# Patient Record
Sex: Female | Born: 1965
Health system: Southern US, Community
[De-identification: ages and names within clinical notes are randomized; demographics above are authoritative.]

## PROBLEM LIST (undated history)

## (undated) ENCOUNTER — Ambulatory Visit: Source: Home / Self Care

## (undated) DIAGNOSIS — E039 Hypothyroidism, unspecified: Secondary | ICD-10-CM

## (undated) DIAGNOSIS — I10 Essential (primary) hypertension: Secondary | ICD-10-CM

## (undated) DIAGNOSIS — H401131 Primary open-angle glaucoma, bilateral, mild stage: Secondary | ICD-10-CM

## (undated) DIAGNOSIS — R739 Hyperglycemia, unspecified: Secondary | ICD-10-CM

## (undated) DIAGNOSIS — J45909 Unspecified asthma, uncomplicated: Secondary | ICD-10-CM

## (undated) DIAGNOSIS — E7800 Pure hypercholesterolemia, unspecified: Secondary | ICD-10-CM

## (undated) DIAGNOSIS — E119 Type 2 diabetes mellitus without complications: Secondary | ICD-10-CM

## (undated) DIAGNOSIS — H409 Unspecified glaucoma: Secondary | ICD-10-CM

## (undated) DIAGNOSIS — R519 Headache, unspecified: Secondary | ICD-10-CM

## (undated) HISTORY — DX: Headache, unspecified: R51.9

## (undated) HISTORY — DX: Pure hypercholesterolemia, unspecified: E78.00

## (undated) HISTORY — DX: Hypothyroidism, unspecified: E03.9

## (undated) HISTORY — DX: Essential (primary) hypertension: I10

## (undated) HISTORY — DX: Type 2 diabetes mellitus without complications: E11.9

## (undated) HISTORY — DX: Hyperglycemia, unspecified: R73.9

## (undated) HISTORY — DX: Morbid (severe) obesity due to excess calories: E66.01

## (undated) HISTORY — DX: Unspecified glaucoma: H40.9

## (undated) HISTORY — DX: Primary open-angle glaucoma, bilateral, mild stage: H40.1131

## (undated) HISTORY — DX: Unspecified asthma, uncomplicated: J45.909

---

## 2001-05-13 ENCOUNTER — Other Ambulatory Visit: Admission: RE | Admit: 2001-05-13 | Discharge: 2001-05-13 | Payer: Self-pay | Admitting: Obstetrics and Gynecology

## 2001-09-23 ENCOUNTER — Other Ambulatory Visit: Admission: RE | Admit: 2001-09-23 | Discharge: 2001-09-23 | Payer: Self-pay | Admitting: Obstetrics and Gynecology

## 2002-05-12 ENCOUNTER — Other Ambulatory Visit: Admission: RE | Admit: 2002-05-12 | Discharge: 2002-05-12 | Payer: Self-pay | Admitting: Obstetrics and Gynecology

## 2002-12-28 ENCOUNTER — Other Ambulatory Visit: Admission: RE | Admit: 2002-12-28 | Discharge: 2002-12-28 | Payer: Self-pay | Admitting: Obstetrics and Gynecology

## 2004-06-16 ENCOUNTER — Encounter: Admission: RE | Admit: 2004-06-16 | Discharge: 2004-06-16 | Payer: Self-pay | Admitting: Obstetrics and Gynecology

## 2004-07-02 ENCOUNTER — Encounter: Admission: RE | Admit: 2004-07-02 | Discharge: 2004-07-02 | Payer: Self-pay | Admitting: Obstetrics and Gynecology

## 2004-11-27 ENCOUNTER — Other Ambulatory Visit: Admission: RE | Admit: 2004-11-27 | Discharge: 2004-11-27 | Payer: Self-pay | Admitting: Obstetrics and Gynecology

## 2005-01-06 ENCOUNTER — Encounter: Admission: RE | Admit: 2005-01-06 | Discharge: 2005-01-06 | Payer: Self-pay | Admitting: Family Medicine

## 2005-09-07 ENCOUNTER — Encounter: Admission: RE | Admit: 2005-09-07 | Discharge: 2005-09-07 | Payer: Self-pay | Admitting: Family Medicine

## 2006-11-09 ENCOUNTER — Encounter: Admission: RE | Admit: 2006-11-09 | Discharge: 2006-11-09 | Payer: Self-pay | Admitting: Family Medicine

## 2007-11-18 ENCOUNTER — Encounter: Admission: RE | Admit: 2007-11-18 | Discharge: 2007-11-18 | Payer: Self-pay | Admitting: Family Medicine

## 2009-09-26 ENCOUNTER — Encounter: Admission: RE | Admit: 2009-09-26 | Discharge: 2009-09-26 | Payer: Self-pay | Admitting: Family Medicine

## 2010-03-23 ENCOUNTER — Encounter: Payer: Self-pay | Admitting: Obstetrics and Gynecology

## 2010-09-29 ENCOUNTER — Other Ambulatory Visit: Payer: Self-pay | Admitting: Family Medicine

## 2010-09-29 DIAGNOSIS — Z1231 Encounter for screening mammogram for malignant neoplasm of breast: Secondary | ICD-10-CM

## 2010-10-21 ENCOUNTER — Ambulatory Visit
Admission: RE | Admit: 2010-10-21 | Discharge: 2010-10-21 | Disposition: A | Discharge status: Home / Self Care | Payer: Managed Care, Other (non HMO) | Source: Ambulatory Visit | Attending: Family Medicine | Admitting: Family Medicine

## 2010-10-21 DIAGNOSIS — Z1231 Encounter for screening mammogram for malignant neoplasm of breast: Secondary | ICD-10-CM

## 2011-10-21 ENCOUNTER — Other Ambulatory Visit: Payer: Self-pay | Admitting: Family Medicine

## 2011-10-21 DIAGNOSIS — Z1231 Encounter for screening mammogram for malignant neoplasm of breast: Secondary | ICD-10-CM

## 2011-11-27 ENCOUNTER — Ambulatory Visit
Admission: RE | Admit: 2011-11-27 | Discharge: 2011-11-27 | Disposition: A | Discharge status: Home / Self Care | Payer: Private Health Insurance - Indemnity | Source: Ambulatory Visit | Attending: Family Medicine | Admitting: Family Medicine

## 2011-11-27 ENCOUNTER — Other Ambulatory Visit: Payer: Self-pay | Admitting: Family Medicine

## 2011-11-27 DIAGNOSIS — Z1231 Encounter for screening mammogram for malignant neoplasm of breast: Secondary | ICD-10-CM

## 2012-12-27 ENCOUNTER — Other Ambulatory Visit: Payer: Self-pay

## 2012-12-27 DIAGNOSIS — Z1231 Encounter for screening mammogram for malignant neoplasm of breast: Secondary | ICD-10-CM

## 2013-01-23 ENCOUNTER — Ambulatory Visit
Admission: RE | Admit: 2013-01-23 | Discharge: 2013-01-23 | Disposition: A | Discharge status: Home / Self Care | Payer: Private Health Insurance - Indemnity | Source: Ambulatory Visit

## 2013-01-23 DIAGNOSIS — Z1231 Encounter for screening mammogram for malignant neoplasm of breast: Secondary | ICD-10-CM

## 2014-01-01 ENCOUNTER — Other Ambulatory Visit: Payer: Self-pay

## 2014-01-01 DIAGNOSIS — Z1231 Encounter for screening mammogram for malignant neoplasm of breast: Secondary | ICD-10-CM

## 2014-01-24 ENCOUNTER — Ambulatory Visit
Admission: RE | Admit: 2014-01-24 | Discharge: 2014-01-24 | Disposition: A | Discharge status: Home / Self Care | Payer: Managed Care, Other (non HMO) | Source: Ambulatory Visit

## 2014-01-24 DIAGNOSIS — Z1231 Encounter for screening mammogram for malignant neoplasm of breast: Secondary | ICD-10-CM

## 2014-05-01 HISTORY — PX: COLONOSCOPY: SHX174

## 2015-01-14 ENCOUNTER — Other Ambulatory Visit: Payer: Self-pay

## 2015-01-14 DIAGNOSIS — Z1231 Encounter for screening mammogram for malignant neoplasm of breast: Secondary | ICD-10-CM

## 2015-02-04 ENCOUNTER — Ambulatory Visit
Admission: RE | Admit: 2015-02-04 | Discharge: 2015-02-04 | Disposition: A | Discharge status: Home / Self Care | Payer: BLUE CROSS/BLUE SHIELD | Source: Ambulatory Visit

## 2015-02-04 DIAGNOSIS — Z1231 Encounter for screening mammogram for malignant neoplasm of breast: Secondary | ICD-10-CM

## 2015-07-05 DIAGNOSIS — R300 Dysuria: Secondary | ICD-10-CM | POA: Diagnosis not present

## 2015-08-06 DIAGNOSIS — S62502A Fracture of unspecified phalanx of left thumb, initial encounter for closed fracture: Secondary | ICD-10-CM | POA: Diagnosis not present

## 2015-08-06 DIAGNOSIS — S6992XA Unspecified injury of left wrist, hand and finger(s), initial encounter: Secondary | ICD-10-CM | POA: Diagnosis not present

## 2015-08-06 DIAGNOSIS — Z6841 Body Mass Index (BMI) 40.0 and over, adult: Secondary | ICD-10-CM | POA: Diagnosis not present

## 2015-08-06 DIAGNOSIS — M25562 Pain in left knee: Secondary | ICD-10-CM | POA: Diagnosis not present

## 2015-08-06 DIAGNOSIS — S8992XA Unspecified injury of left lower leg, initial encounter: Secondary | ICD-10-CM | POA: Diagnosis not present

## 2015-08-06 DIAGNOSIS — M79645 Pain in left finger(s): Secondary | ICD-10-CM | POA: Diagnosis not present

## 2015-08-06 DIAGNOSIS — M7989 Other specified soft tissue disorders: Secondary | ICD-10-CM | POA: Diagnosis not present

## 2015-08-07 DIAGNOSIS — H401131 Primary open-angle glaucoma, bilateral, mild stage: Secondary | ICD-10-CM | POA: Diagnosis not present

## 2015-08-07 DIAGNOSIS — E039 Hypothyroidism, unspecified: Secondary | ICD-10-CM | POA: Diagnosis not present

## 2015-08-07 DIAGNOSIS — E119 Type 2 diabetes mellitus without complications: Secondary | ICD-10-CM | POA: Diagnosis not present

## 2015-08-07 DIAGNOSIS — E7800 Pure hypercholesterolemia, unspecified: Secondary | ICD-10-CM | POA: Diagnosis not present

## 2015-08-07 DIAGNOSIS — I10 Essential (primary) hypertension: Secondary | ICD-10-CM | POA: Diagnosis not present

## 2015-08-10 DIAGNOSIS — S6982XA Other specified injuries of left wrist, hand and finger(s), initial encounter: Secondary | ICD-10-CM | POA: Diagnosis not present

## 2015-08-10 DIAGNOSIS — S8982XA Other specified injuries of left lower leg, initial encounter: Secondary | ICD-10-CM | POA: Diagnosis not present

## 2015-11-27 DIAGNOSIS — H16141 Punctate keratitis, right eye: Secondary | ICD-10-CM | POA: Diagnosis not present

## 2016-01-15 ENCOUNTER — Other Ambulatory Visit: Payer: Self-pay | Admitting: Family Medicine

## 2016-01-15 DIAGNOSIS — Z1231 Encounter for screening mammogram for malignant neoplasm of breast: Secondary | ICD-10-CM

## 2016-02-12 ENCOUNTER — Ambulatory Visit
Admission: RE | Admit: 2016-02-12 | Discharge: 2016-02-12 | Disposition: A | Discharge status: Home / Self Care | Payer: BLUE CROSS/BLUE SHIELD | Source: Ambulatory Visit | Attending: Family Medicine | Admitting: Family Medicine

## 2016-02-12 ENCOUNTER — Encounter: Payer: Self-pay | Admitting: Radiology

## 2016-02-12 DIAGNOSIS — J4530 Mild persistent asthma, uncomplicated: Secondary | ICD-10-CM | POA: Diagnosis not present

## 2016-02-12 DIAGNOSIS — Z1231 Encounter for screening mammogram for malignant neoplasm of breast: Secondary | ICD-10-CM

## 2016-02-12 DIAGNOSIS — E119 Type 2 diabetes mellitus without complications: Secondary | ICD-10-CM | POA: Diagnosis not present

## 2016-02-12 DIAGNOSIS — E1165 Type 2 diabetes mellitus with hyperglycemia: Secondary | ICD-10-CM | POA: Diagnosis not present

## 2016-02-12 DIAGNOSIS — E7800 Pure hypercholesterolemia, unspecified: Secondary | ICD-10-CM | POA: Diagnosis not present

## 2016-02-12 DIAGNOSIS — E039 Hypothyroidism, unspecified: Secondary | ICD-10-CM | POA: Diagnosis not present

## 2016-02-12 DIAGNOSIS — Z0000 Encounter for general adult medical examination without abnormal findings: Secondary | ICD-10-CM | POA: Diagnosis not present

## 2016-02-12 DIAGNOSIS — I10 Essential (primary) hypertension: Secondary | ICD-10-CM | POA: Diagnosis not present

## 2016-02-17 DIAGNOSIS — H401131 Primary open-angle glaucoma, bilateral, mild stage: Secondary | ICD-10-CM | POA: Diagnosis not present

## 2016-05-20 DIAGNOSIS — H401131 Primary open-angle glaucoma, bilateral, mild stage: Secondary | ICD-10-CM | POA: Diagnosis not present

## 2016-05-26 DIAGNOSIS — H20021 Recurrent acute iridocyclitis, right eye: Secondary | ICD-10-CM | POA: Diagnosis not present

## 2016-06-29 DIAGNOSIS — H20011 Primary iridocyclitis, right eye: Secondary | ICD-10-CM | POA: Diagnosis not present

## 2016-07-06 DIAGNOSIS — H20023 Recurrent acute iridocyclitis, bilateral: Secondary | ICD-10-CM | POA: Diagnosis not present

## 2016-07-30 DIAGNOSIS — H20011 Primary iridocyclitis, right eye: Secondary | ICD-10-CM | POA: Diagnosis not present

## 2016-08-13 DIAGNOSIS — E7800 Pure hypercholesterolemia, unspecified: Secondary | ICD-10-CM | POA: Diagnosis not present

## 2016-08-13 DIAGNOSIS — E1165 Type 2 diabetes mellitus with hyperglycemia: Secondary | ICD-10-CM | POA: Diagnosis not present

## 2016-08-13 DIAGNOSIS — I10 Essential (primary) hypertension: Secondary | ICD-10-CM | POA: Diagnosis not present

## 2016-08-13 DIAGNOSIS — Z7984 Long term (current) use of oral hypoglycemic drugs: Secondary | ICD-10-CM | POA: Diagnosis not present

## 2016-08-17 DIAGNOSIS — H20013 Primary iridocyclitis, bilateral: Secondary | ICD-10-CM | POA: Diagnosis not present

## 2016-11-16 DIAGNOSIS — E119 Type 2 diabetes mellitus without complications: Secondary | ICD-10-CM | POA: Diagnosis not present

## 2016-11-17 DIAGNOSIS — E1165 Type 2 diabetes mellitus with hyperglycemia: Secondary | ICD-10-CM | POA: Diagnosis not present

## 2017-01-13 ENCOUNTER — Other Ambulatory Visit: Payer: Self-pay | Admitting: Family Medicine

## 2017-01-13 ENCOUNTER — Other Ambulatory Visit: Payer: Self-pay

## 2017-01-13 DIAGNOSIS — Z139 Encounter for screening, unspecified: Secondary | ICD-10-CM

## 2017-02-12 ENCOUNTER — Ambulatory Visit
Admission: RE | Admit: 2017-02-12 | Discharge: 2017-02-12 | Disposition: A | Discharge status: Home / Self Care | Payer: BLUE CROSS/BLUE SHIELD | Source: Ambulatory Visit | Attending: Family Medicine | Admitting: Family Medicine

## 2017-02-12 DIAGNOSIS — Z0000 Encounter for general adult medical examination without abnormal findings: Secondary | ICD-10-CM | POA: Diagnosis not present

## 2017-02-12 DIAGNOSIS — Z1231 Encounter for screening mammogram for malignant neoplasm of breast: Secondary | ICD-10-CM | POA: Diagnosis not present

## 2017-02-12 DIAGNOSIS — E7800 Pure hypercholesterolemia, unspecified: Secondary | ICD-10-CM | POA: Diagnosis not present

## 2017-02-12 DIAGNOSIS — Z139 Encounter for screening, unspecified: Secondary | ICD-10-CM

## 2017-02-12 DIAGNOSIS — E039 Hypothyroidism, unspecified: Secondary | ICD-10-CM | POA: Diagnosis not present

## 2017-02-12 DIAGNOSIS — E119 Type 2 diabetes mellitus without complications: Secondary | ICD-10-CM | POA: Diagnosis not present

## 2017-02-17 DIAGNOSIS — H401131 Primary open-angle glaucoma, bilateral, mild stage: Secondary | ICD-10-CM | POA: Diagnosis not present

## 2017-04-05 DIAGNOSIS — N950 Postmenopausal bleeding: Secondary | ICD-10-CM | POA: Diagnosis not present

## 2017-04-05 DIAGNOSIS — Z124 Encounter for screening for malignant neoplasm of cervix: Secondary | ICD-10-CM | POA: Diagnosis not present

## 2017-04-05 DIAGNOSIS — Z01419 Encounter for gynecological examination (general) (routine) without abnormal findings: Secondary | ICD-10-CM | POA: Diagnosis not present

## 2017-04-05 DIAGNOSIS — Z1389 Encounter for screening for other disorder: Secondary | ICD-10-CM | POA: Diagnosis not present

## 2017-04-05 DIAGNOSIS — Z130 Encounter for screening for diseases of the blood and blood-forming organs and certain disorders involving the immune mechanism: Secondary | ICD-10-CM | POA: Diagnosis not present

## 2017-04-05 DIAGNOSIS — Z6841 Body Mass Index (BMI) 40.0 and over, adult: Secondary | ICD-10-CM | POA: Diagnosis not present

## 2017-04-05 DIAGNOSIS — Z1151 Encounter for screening for human papillomavirus (HPV): Secondary | ICD-10-CM | POA: Diagnosis not present

## 2017-04-05 DIAGNOSIS — N939 Abnormal uterine and vaginal bleeding, unspecified: Secondary | ICD-10-CM | POA: Diagnosis not present

## 2017-04-06 DIAGNOSIS — Z124 Encounter for screening for malignant neoplasm of cervix: Secondary | ICD-10-CM | POA: Diagnosis not present

## 2017-05-05 DIAGNOSIS — N950 Postmenopausal bleeding: Secondary | ICD-10-CM | POA: Diagnosis not present

## 2017-05-17 DIAGNOSIS — H401131 Primary open-angle glaucoma, bilateral, mild stage: Secondary | ICD-10-CM | POA: Diagnosis not present

## 2017-05-21 DIAGNOSIS — E1165 Type 2 diabetes mellitus with hyperglycemia: Secondary | ICD-10-CM | POA: Diagnosis not present

## 2017-07-09 DIAGNOSIS — R300 Dysuria: Secondary | ICD-10-CM | POA: Diagnosis not present

## 2017-08-04 DIAGNOSIS — N950 Postmenopausal bleeding: Secondary | ICD-10-CM | POA: Diagnosis not present

## 2017-08-13 DIAGNOSIS — Z1159 Encounter for screening for other viral diseases: Secondary | ICD-10-CM | POA: Diagnosis not present

## 2017-08-13 DIAGNOSIS — E119 Type 2 diabetes mellitus without complications: Secondary | ICD-10-CM | POA: Diagnosis not present

## 2017-08-13 DIAGNOSIS — E7800 Pure hypercholesterolemia, unspecified: Secondary | ICD-10-CM | POA: Diagnosis not present

## 2017-08-13 DIAGNOSIS — E039 Hypothyroidism, unspecified: Secondary | ICD-10-CM | POA: Diagnosis not present

## 2017-08-13 DIAGNOSIS — I10 Essential (primary) hypertension: Secondary | ICD-10-CM | POA: Diagnosis not present

## 2017-08-24 DIAGNOSIS — H401131 Primary open-angle glaucoma, bilateral, mild stage: Secondary | ICD-10-CM | POA: Diagnosis not present

## 2017-10-15 DIAGNOSIS — I10 Essential (primary) hypertension: Secondary | ICD-10-CM | POA: Diagnosis not present

## 2017-10-15 DIAGNOSIS — R51 Headache: Secondary | ICD-10-CM | POA: Diagnosis not present

## 2017-10-15 DIAGNOSIS — R112 Nausea with vomiting, unspecified: Secondary | ICD-10-CM | POA: Diagnosis not present

## 2017-10-15 DIAGNOSIS — E7800 Pure hypercholesterolemia, unspecified: Secondary | ICD-10-CM | POA: Diagnosis not present

## 2017-11-23 DIAGNOSIS — E119 Type 2 diabetes mellitus without complications: Secondary | ICD-10-CM | POA: Diagnosis not present

## 2017-11-23 DIAGNOSIS — H401131 Primary open-angle glaucoma, bilateral, mild stage: Secondary | ICD-10-CM | POA: Diagnosis not present

## 2018-01-04 ENCOUNTER — Other Ambulatory Visit: Payer: Self-pay | Admitting: Family Medicine

## 2018-01-04 DIAGNOSIS — Z1231 Encounter for screening mammogram for malignant neoplasm of breast: Secondary | ICD-10-CM

## 2018-01-10 DIAGNOSIS — E1165 Type 2 diabetes mellitus with hyperglycemia: Secondary | ICD-10-CM | POA: Diagnosis not present

## 2018-02-09 DIAGNOSIS — H401131 Primary open-angle glaucoma, bilateral, mild stage: Secondary | ICD-10-CM | POA: Diagnosis not present

## 2018-02-15 DIAGNOSIS — E039 Hypothyroidism, unspecified: Secondary | ICD-10-CM | POA: Diagnosis not present

## 2018-02-15 DIAGNOSIS — Z0000 Encounter for general adult medical examination without abnormal findings: Secondary | ICD-10-CM | POA: Diagnosis not present

## 2018-02-21 ENCOUNTER — Ambulatory Visit: Payer: BLUE CROSS/BLUE SHIELD

## 2018-03-14 ENCOUNTER — Ambulatory Visit
Admission: RE | Admit: 2018-03-14 | Discharge: 2018-03-14 | Disposition: A | Discharge status: Home / Self Care | Payer: BLUE CROSS/BLUE SHIELD | Source: Ambulatory Visit | Attending: Family Medicine | Admitting: Family Medicine

## 2018-03-14 DIAGNOSIS — Z1231 Encounter for screening mammogram for malignant neoplasm of breast: Secondary | ICD-10-CM

## 2018-03-14 DIAGNOSIS — Z1389 Encounter for screening for other disorder: Secondary | ICD-10-CM | POA: Diagnosis not present

## 2018-03-14 DIAGNOSIS — Z01419 Encounter for gynecological examination (general) (routine) without abnormal findings: Secondary | ICD-10-CM | POA: Diagnosis not present

## 2018-03-14 DIAGNOSIS — Z130 Encounter for screening for diseases of the blood and blood-forming organs and certain disorders involving the immune mechanism: Secondary | ICD-10-CM | POA: Diagnosis not present

## 2018-03-14 DIAGNOSIS — Z6841 Body Mass Index (BMI) 40.0 and over, adult: Secondary | ICD-10-CM | POA: Diagnosis not present

## 2018-05-04 ENCOUNTER — Encounter: Payer: Self-pay | Admitting: Cardiology

## 2018-05-11 DIAGNOSIS — H401131 Primary open-angle glaucoma, bilateral, mild stage: Secondary | ICD-10-CM | POA: Diagnosis not present

## 2018-05-11 DIAGNOSIS — I10 Essential (primary) hypertension: Secondary | ICD-10-CM | POA: Diagnosis not present

## 2018-05-11 DIAGNOSIS — E7800 Pure hypercholesterolemia, unspecified: Secondary | ICD-10-CM | POA: Diagnosis not present

## 2018-05-11 DIAGNOSIS — E119 Type 2 diabetes mellitus without complications: Secondary | ICD-10-CM | POA: Diagnosis not present

## 2018-05-17 ENCOUNTER — Telehealth: Payer: Self-pay

## 2018-05-17 DIAGNOSIS — E1165 Type 2 diabetes mellitus with hyperglycemia: Secondary | ICD-10-CM | POA: Diagnosis not present

## 2018-05-17 DIAGNOSIS — E039 Hypothyroidism, unspecified: Secondary | ICD-10-CM | POA: Diagnosis not present

## 2018-05-17 DIAGNOSIS — M25531 Pain in right wrist: Secondary | ICD-10-CM | POA: Diagnosis not present

## 2018-05-17 NOTE — Telephone Encounter (Signed)
Patient expressed understanding about reschedule. Family history of CAD.

## 2018-05-18 ENCOUNTER — Ambulatory Visit: Payer: BLUE CROSS/BLUE SHIELD | Admitting: Cardiology

## 2018-06-23 ENCOUNTER — Ambulatory Visit: Payer: BLUE CROSS/BLUE SHIELD | Admitting: Cardiology

## 2018-08-11 DIAGNOSIS — H401131 Primary open-angle glaucoma, bilateral, mild stage: Secondary | ICD-10-CM | POA: Diagnosis not present

## 2018-11-14 DIAGNOSIS — E119 Type 2 diabetes mellitus without complications: Secondary | ICD-10-CM | POA: Diagnosis not present

## 2018-11-23 DIAGNOSIS — L659 Nonscarring hair loss, unspecified: Secondary | ICD-10-CM | POA: Diagnosis not present

## 2018-11-23 DIAGNOSIS — E7800 Pure hypercholesterolemia, unspecified: Secondary | ICD-10-CM | POA: Diagnosis not present

## 2018-11-23 DIAGNOSIS — E119 Type 2 diabetes mellitus without complications: Secondary | ICD-10-CM | POA: Diagnosis not present

## 2018-11-23 DIAGNOSIS — I10 Essential (primary) hypertension: Secondary | ICD-10-CM | POA: Diagnosis not present

## 2018-11-25 DIAGNOSIS — E7800 Pure hypercholesterolemia, unspecified: Secondary | ICD-10-CM | POA: Diagnosis not present

## 2018-11-25 DIAGNOSIS — E119 Type 2 diabetes mellitus without complications: Secondary | ICD-10-CM | POA: Diagnosis not present

## 2018-11-25 DIAGNOSIS — E039 Hypothyroidism, unspecified: Secondary | ICD-10-CM | POA: Diagnosis not present

## 2019-03-07 ENCOUNTER — Other Ambulatory Visit: Payer: Self-pay | Admitting: Family Medicine

## 2019-03-07 DIAGNOSIS — Z1231 Encounter for screening mammogram for malignant neoplasm of breast: Secondary | ICD-10-CM

## 2019-04-03 DIAGNOSIS — H401131 Primary open-angle glaucoma, bilateral, mild stage: Secondary | ICD-10-CM | POA: Diagnosis not present

## 2019-04-10 DIAGNOSIS — L668 Other cicatricial alopecia: Secondary | ICD-10-CM | POA: Diagnosis not present

## 2019-04-10 DIAGNOSIS — L638 Other alopecia areata: Secondary | ICD-10-CM | POA: Diagnosis not present

## 2019-05-23 ENCOUNTER — Other Ambulatory Visit: Payer: Self-pay

## 2019-05-23 ENCOUNTER — Ambulatory Visit
Admission: RE | Admit: 2019-05-23 | Discharge: 2019-05-23 | Disposition: A | Discharge status: Home / Self Care | Payer: BC Managed Care – PPO | Source: Ambulatory Visit | Attending: Family Medicine | Admitting: Family Medicine

## 2019-05-23 DIAGNOSIS — E119 Type 2 diabetes mellitus without complications: Secondary | ICD-10-CM | POA: Diagnosis not present

## 2019-05-23 DIAGNOSIS — E7800 Pure hypercholesterolemia, unspecified: Secondary | ICD-10-CM | POA: Diagnosis not present

## 2019-05-23 DIAGNOSIS — Z0000 Encounter for general adult medical examination without abnormal findings: Secondary | ICD-10-CM | POA: Diagnosis not present

## 2019-05-23 DIAGNOSIS — Z1231 Encounter for screening mammogram for malignant neoplasm of breast: Secondary | ICD-10-CM

## 2019-05-23 DIAGNOSIS — I10 Essential (primary) hypertension: Secondary | ICD-10-CM | POA: Diagnosis not present

## 2019-05-25 DIAGNOSIS — L638 Other alopecia areata: Secondary | ICD-10-CM | POA: Diagnosis not present

## 2019-05-25 DIAGNOSIS — L668 Other cicatricial alopecia: Secondary | ICD-10-CM | POA: Diagnosis not present

## 2019-05-26 IMAGING — MG DIGITAL SCREENING BILATERAL MAMMOGRAM WITH CAD
8 series · 8 of 8 positions shown · non-contrast
Comparison: Previous exam(s).

CLINICAL DATA: Screening.

EXAM:
DIGITAL SCREENING BILATERAL MAMMOGRAM WITH CAD

[L CC (1 of 2)]
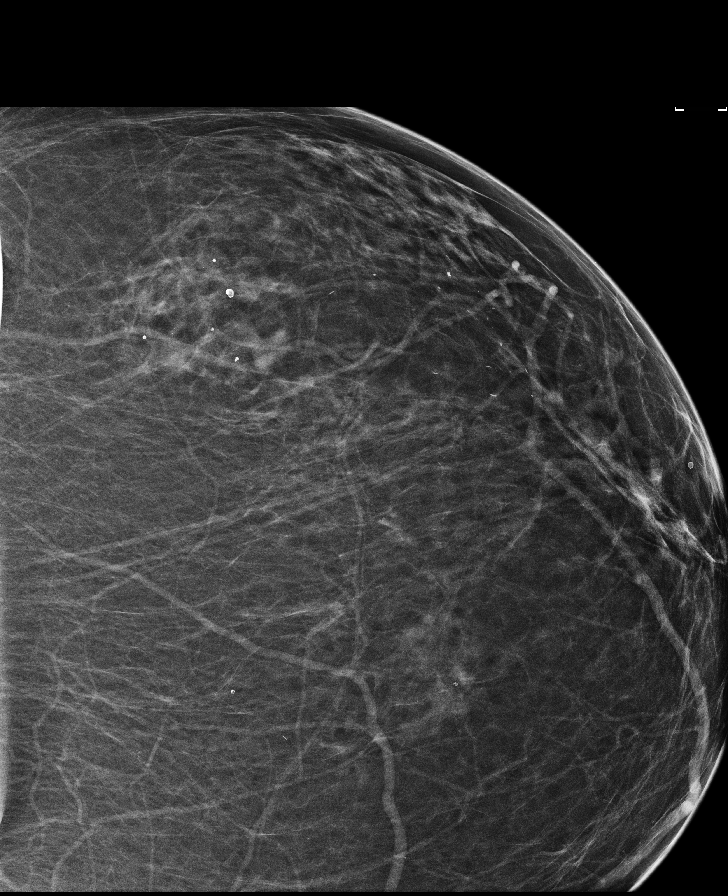

[R CC (1 of 2)]
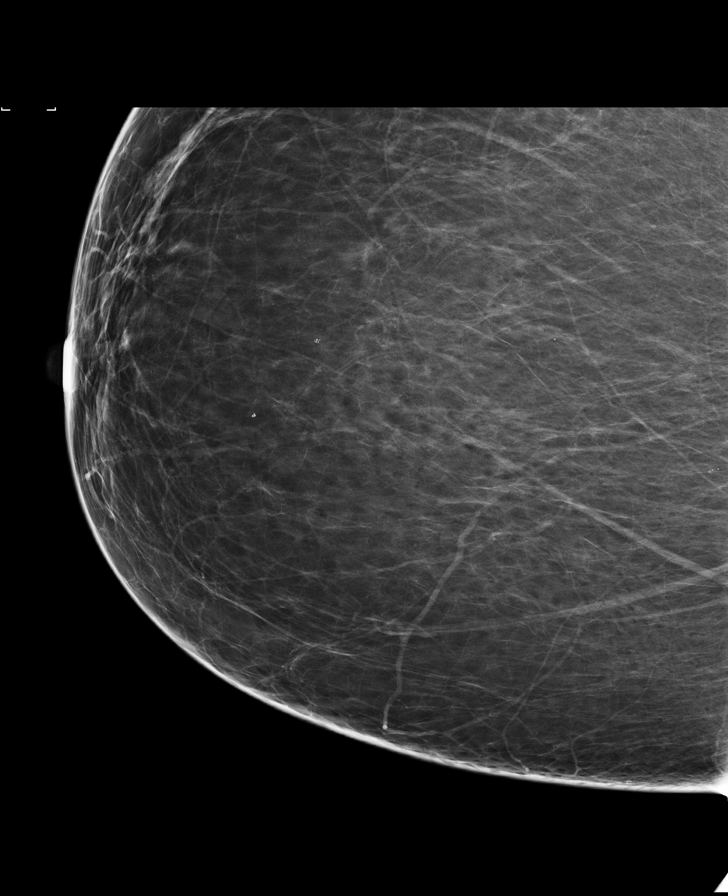

[L MLO (1 of 2)]
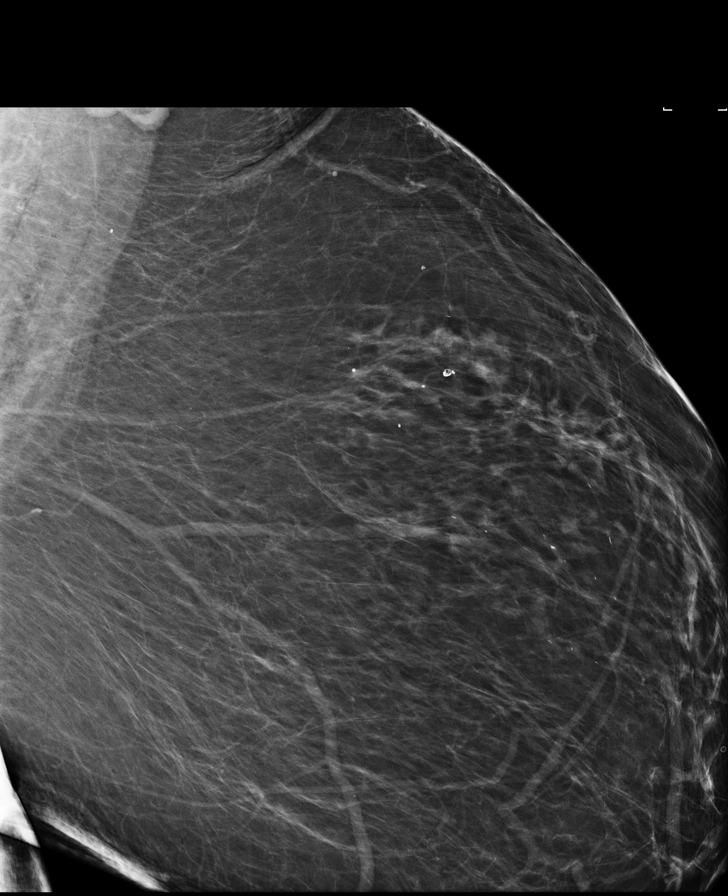

[L MLO (2 of 2)]
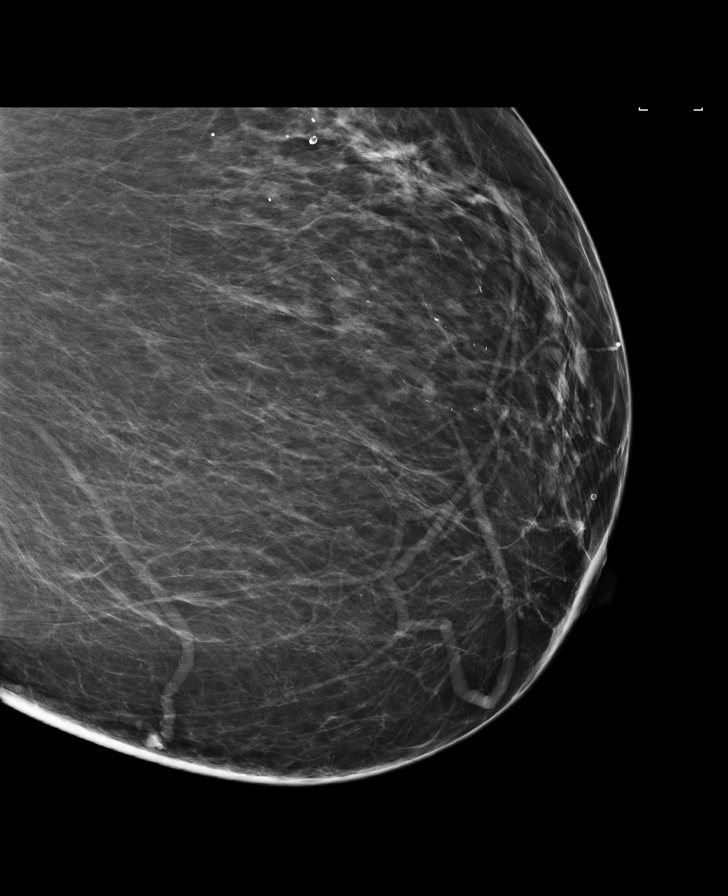

[L CC (2 of 2)]
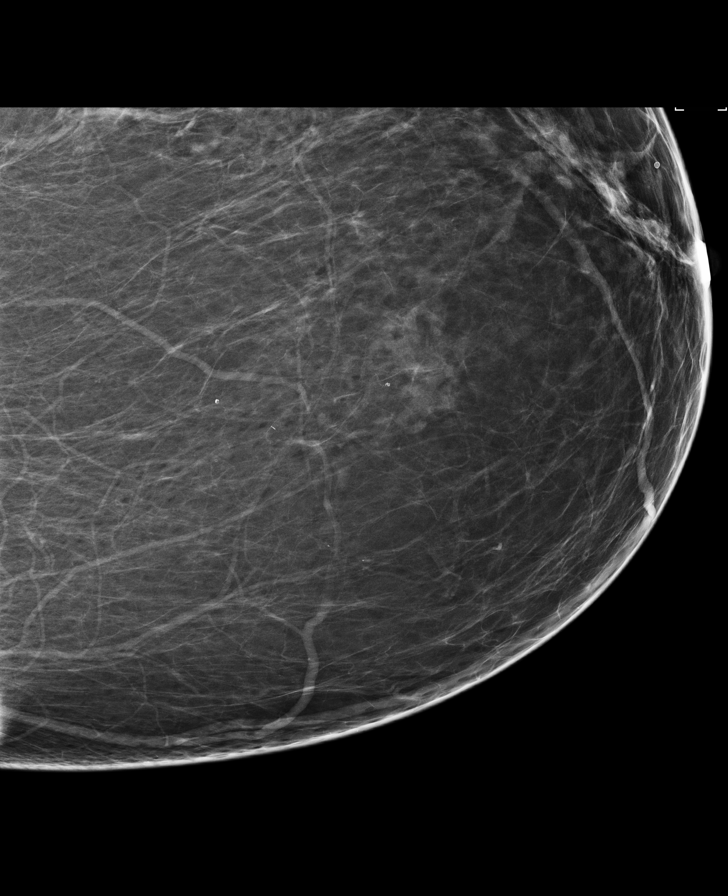

[R CC (2 of 2)]
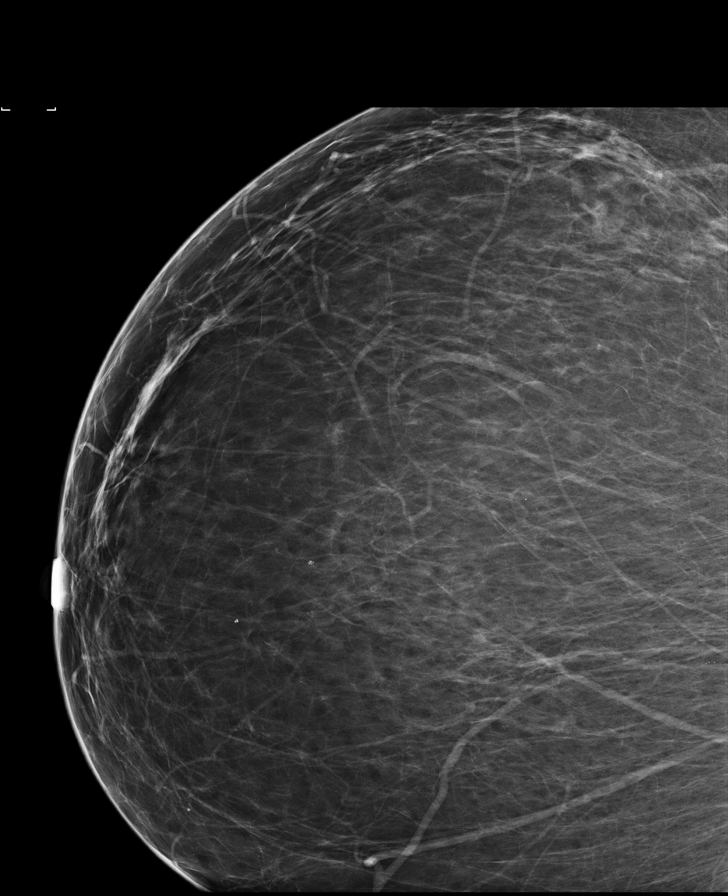

[R MLO (1 of 2)]
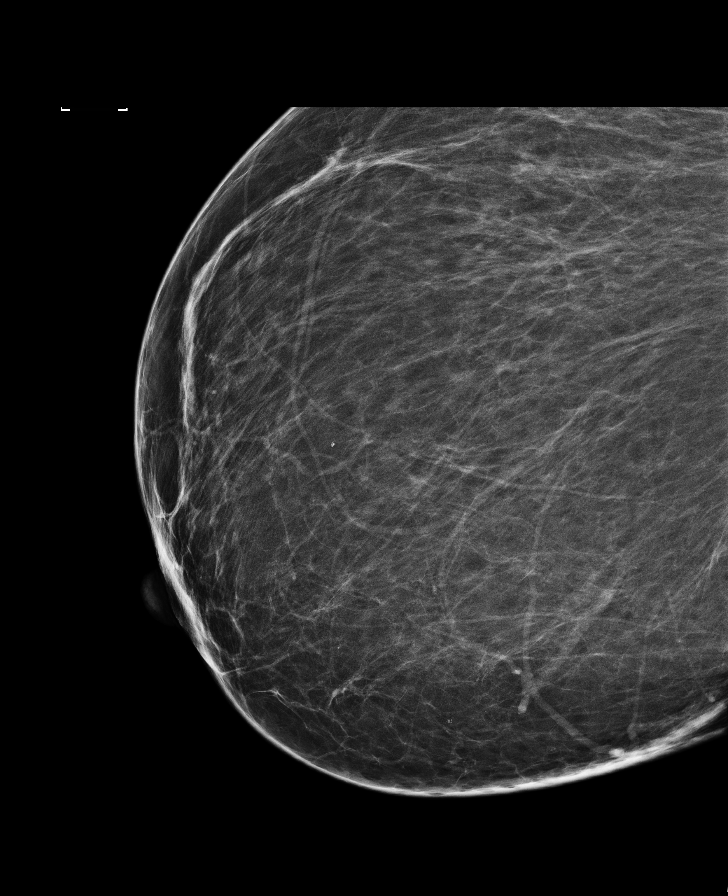

[R MLO (2 of 2)]
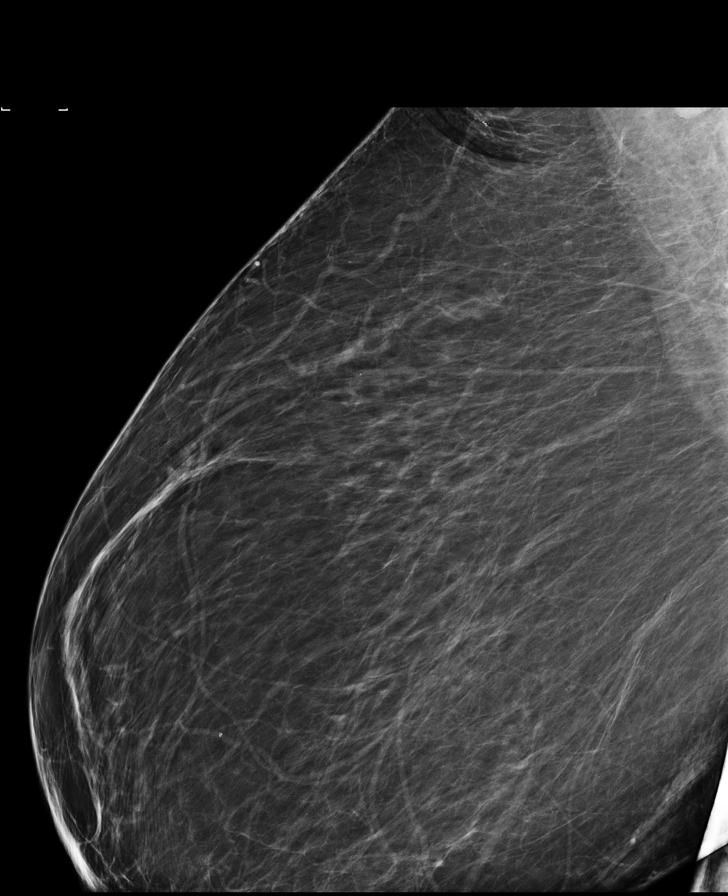

[8 of 8 positions shown; findings below may reference images not displayed]

ACR Breast Density Category b: There are scattered areas of
fibroglandular density.
FINDINGS: There are no findings suspicious for malignancy. Images were
processed with CAD.
IMPRESSION: No mammographic evidence of malignancy. A result letter of this
screening mammogram will be mailed directly to the patient.

RECOMMENDATION:
Screening mammogram in one year. (Code:AS-G-LCT)

BI-RADS CATEGORY  1: Negative.

## 2019-07-03 DIAGNOSIS — H401131 Primary open-angle glaucoma, bilateral, mild stage: Secondary | ICD-10-CM | POA: Diagnosis not present

## 2019-07-16 NOTE — Progress Notes (Signed)
Chief Complaint  Patient presents with  . New Patient (Initial Visit)   History of Present Illness: 54 yo female with history of diabetes, HTN, hyperlipidemia and hypothyroidism who is here today as a new patient for cardiac risk assessment given her strong family history of CAD. Her mother, brother and sister all have premature CAD. She has no known heart disease. She has been having pains in her left neck and over her left chest wall. No exertional chest pain. No dyspnea, dizziness, near syncope, syncope. Occasional LE edema. She has never smoked cigarettes.   Primary Care Physician: Alroy Dust, L.Marlou Sa, MD  Past Medical History:  Diagnosis Date  . Asthma   . DM (diabetes mellitus) ([place])   . Elevated blood sugar   . Glaucoma    DR. [place]  . Head ache   . Hypercholesterolemia   . Hypertension   . Hypothyroid   . Hypothyroidism   . Morbid obesity ([place])   . Primary open angle glaucoma of both eyes, mild stage     Past Surgical History:  Procedure Laterality Date  . CESAREAN SECTION  1993  . COLONOSCOPY  05/2014    Current Outpatient Medications  Medication Sig Dispense Refill  . albuterol (PROVENTIL HFA;VENTOLIN HFA) 108 (90 Base) MCG/ACT inhaler Inhale into the lungs every 4 (four) hours as needed for wheezing or shortness of breath.    Marland Kitchen amLODipine (NORVASC) 10 MG tablet Take 10 mg by mouth daily.    . Brinzolamide-Brimonidine (SIMBRINZA) 1-0.2 % SUSP Apply 1 drop to eye 3 (three) times daily.    Marland Kitchen Ertugliflozin L-PyroglutamicAc (STEGLATRO) 5 MG TABS Take 1 tablet by mouth daily.    Marland Kitchen GLYCERIN-POLYSORBATE 80 OP Apply to eye as directed. REFRESH DRY EYE THERAPY    . hydrochlorothiazide (HYDRODIURIL) 25 MG tablet Take 25 mg by mouth daily.    Marland Kitchen levothyroxine (SYNTHROID, LEVOTHROID) 50 MCG tablet Take 50 mcg by mouth daily before breakfast.    . metFORMIN (GLUCOPHAGE) 1000 MG tablet Take 1,000 mg by mouth 2 (two) times daily with a meal.    . montelukast (SINGULAIR) 10 MG tablet  Take 10 mg by mouth at bedtime.    Marland Kitchen PROGESTERONE MICRONIZED PO Take by mouth.    . promethazine (PHENERGAN) 25 MG tablet Take 25 mg by mouth every 4 (four) hours as needed for nausea or vomiting.    . rosuvastatin (CRESTOR) 10 MG tablet Take 10 mg by mouth daily.    . sitaGLIPtin (JANUVIA) 100 MG tablet Take 100 mg by mouth daily.    . metoprolol tartrate (LOPRESSOR) 50 MG tablet Take one tablet 2 hours prior to CT. Take other tablet with you to the appointment 2 tablet 0   No current facility-administered medications for this visit.    Allergies  Allergen Reactions  . Jardiance [Empagliflozin] Other (See Comments)    SIDE EFFECTS VAGINITIS ONSET DATE 01/13/2017    Social History   Socioeconomic History  . Marital status: Single    Spouse name: Not on file  . Number of children: 1  . Years of education: Not on file  . Highest education level: Not on file  Occupational History  . Occupation: [employer]  Tobacco Use  . Smoking status: Never Smoker  . Smokeless tobacco: Never Used  Substance and Sexual Activity  . Alcohol use: Yes    Comment: Social   . Drug use: Never  . Sexual activity: Not on file  Other Topics Concern  . Not  on file  Social History Narrative  . Not on file   Social Determinants of Health   Financial Resource Strain:   . Difficulty of Paying Living Expenses:   Food Insecurity:   . Worried About Programme researcher, broadcasting/film/video in the Last Year:   . Barista in the Last Year:   Transportation Needs:   . Freight forwarder (Medical):   Marland Kitchen Lack of Transportation (Non-Medical):   Physical Activity:   . Days of Exercise per Week:   . Minutes of Exercise per Session:   Stress:   . Feeling of Stress :   Social Connections:   . Frequency of Communication with Friends and Family:   . Frequency of Social Gatherings with Friends and Family:   . Attends Religious Services:   . Active Member of Clubs or Organizations:   . Attends Tax inspector Meetings:   Marland Kitchen Marital Status:   Intimate Partner Violence:   . Fear of Current or Ex-Partner:   . Emotionally Abused:   Marland Kitchen Physically Abused:   . Sexually Abused:     Family History  Problem Relation Age of Onset  . Diabetes Mother 73  . Hypertension Mother   . CAD Mother   . Hypertension Father 73  . Diabetes Sister 68  . Hypertension Sister   . CAD Brother 56       HALF BROTHER  . Diabetes Maternal Grandmother   . Hypertension Maternal Grandmother   . Hypertension Paternal Grandmother   . CVA Paternal Grandfather   . Hypertension Sister 33  . CAD Sister   . Breast cancer Neg Hx   . Colon cancer Neg Hx   . Colon polyps Neg Hx   . Liver disease Neg Hx     Review of Systems:  As stated in the HPI and otherwise negative.   BP 126/86   Pulse 69   Ht 5\' 5"  (1.651 m)   Wt 257 lb (116.6 kg)   SpO2 98%   BMI 42.77 kg/m   Physical Examination: General: Well developed, well nourished, NAD  HEENT: OP clear, mucus membranes moist  SKIN: warm, dry. No rashes. Neuro: No focal deficits  Musculoskeletal: Muscle strength 5/5 all ext  Psychiatric: Mood and affect normal  Neck: No JVD, no carotid bruits, no thyromegaly, no lymphadenopathy.  Lungs:Clear bilaterally, no wheezes, rhonci, crackles Cardiovascular: Regular rate and rhythm. No murmurs, gallops or rubs. Abdomen:Soft. Bowel sounds present. Non-tender.  Extremities: No lower extremity edema. Pulses are 2 + in the bilateral DP/PT.  EKG:  EKG is ordered today. The ekg ordered today demonstrates Sinus  Recent Labs: No results found for requested labs within last 8760 hours.   Lipid Panel No results found for: CHOL, TRIG, HDL, CHOLHDL, VLDL, LDLCALC, LDLDIRECT   Wt Readings from Last 3 Encounters:  07/17/19 257 lb (116.6 kg)     Assessment and Plan:   1. Chest pain with multiple cardiac risk factors: Her risk factors for CAD include DM, HTN, HLD, obesity and strong FH of CAD. Will arrange a gated  cardiac CTA to exclude CAD. Will also arrange an echo to assess LV size and function. BMET today.   Current medicines are reviewed at length with the patient today.  The patient does not have concerns regarding medicines.  The following changes have been made:  no change  Labs/ tests ordered today include:   Orders Placed This Encounter  Procedures  . CT CORONARY MORPH W/CTA COR  W/SCORE W/CA W/CM &/OR WO/CM  . CT CORONARY FRACTIONAL FLOW RESERVE DATA PREP  . CT CORONARY FRACTIONAL FLOW RESERVE FLUID ANALYSIS  . Basic metabolic panel  . EKG 12-Lead  . ECHOCARDIOGRAM COMPLETE     Disposition:   FU with me in 1 year.    Signed, Verne Carrow, MD 07/17/2019 10:40 AM    [facility] [address] [place], [place], [place]  [address] Phone: [phone]; Fax: [phone]

## 2019-07-17 ENCOUNTER — Other Ambulatory Visit: Payer: Self-pay

## 2019-07-17 ENCOUNTER — Ambulatory Visit: Payer: BC Managed Care – PPO | Admitting: Cardiovascular Disease

## 2019-07-17 VITALS — BP 126/86 | HR 69 | Ht 65.0 in | Wt 257.0 lb

## 2019-07-17 DIAGNOSIS — R079 Chest pain, unspecified: Secondary | ICD-10-CM | POA: Diagnosis not present

## 2019-07-17 DIAGNOSIS — Z01812 Encounter for preprocedural laboratory examination: Secondary | ICD-10-CM | POA: Diagnosis not present

## 2019-07-17 DIAGNOSIS — R072 Precordial pain: Secondary | ICD-10-CM | POA: Diagnosis not present

## 2019-07-17 LAB — BASIC METABOLIC PANEL
BUN/Creatinine Ratio: 16 (ref 9–23)
BUN: 16 mg/dL (ref 6–24)
CO2: 25 mmol/L (ref 20–29)
Calcium: 10.2 mg/dL (ref 8.7–10.2)
Chloride: 95 mmol/L — ABNORMAL LOW (ref 96–106)
Creatinine, Ser: 1 mg/dL (ref 0.57–1.00)
GFR calc Af Amer: 73 mL/min/{1.73_m2} (ref >59)
GFR calc non Af Amer: 64 mL/min/{1.73_m2} (ref >59)
Glucose: 121 mg/dL — ABNORMAL HIGH (ref 65–99)
Potassium: 3.9 mmol/L (ref 3.5–5.2)
Sodium: 136 mmol/L (ref 134–144)

## 2019-07-17 MED ORDER — METOPROLOL TARTRATE 50 MG PO TABS
ORAL_TABLET | ORAL | 0 refills | Status: AC
Start: 2019-07-17 — End: ?

## 2019-07-17 NOTE — Patient Instructions (Addendum)
Medication Instructions:  No changes *If you need a refill on your cardiac medications before your next appointment, please call your pharmacy*   Lab Work: Today: BMET If you have labs (blood work) drawn today and your tests are completely normal, you will receive your results only by: Marland Kitchen MyChart Message (if you have MyChart) OR . A paper copy in the mail If you have any lab test that is abnormal or we need to change your treatment, we will call you to review the results.   Testing/Procedures: Your physician has requested that you have an echocardiogram. Echocardiography is a painless test that uses sound waves to create images of your heart. It provides your doctor with information about the size and shape of your heart and how well your heart's chambers and valves are working. This procedure takes approximately one hour. There are no restrictions for this procedure.  Your physician has requested that you have cardiac CT. Cardiac computed tomography (CT) is a painless test that uses an x-ray machine to take clear, detailed pictures of your heart. For further information please visit [email or web address]. Please follow instruction sheet as given.   Follow-Up: At [facility], you and your health needs are our priority.  As part of our continuing mission to provide you with exceptional heart care, we have created designated Provider Care Teams.  These Care Teams include your primary Cardiologist (physician) and Advanced Practice Providers (APPs -  Physician Assistants and Nurse Practitioners) who all work together to provide you with the care you need, when you need it.  We recommend signing up for the patient portal called "MyChart".  Sign up information is provided on this After Visit Summary.  MyChart is used to connect with patients for Virtual Visits (Telemedicine).  Patients are able to view lab/test results, encounter notes, upcoming appointments, etc.  Non-urgent messages can be sent to  your provider as well.   To learn more about what you can do with MyChart, go to [email or web address].    Your next appointment:   12 month(s)  The format for your next appointment:   Either In Person or Virtual  Provider:   You may see Lauree Chandler, MD or one of the following Advanced Practice Providers on your designated Care Team:    Melina Copa, PA-C  Ermalinda Barrios, PA-C    Other Instructions Your cardiac CT will be scheduled at the below location:   [facility] [address] [place], [place] [address] [phone]  If scheduled at [facility], please arrive at the [facility] main entrance of [facility] 30 minutes prior to test start time. Proceed to the [facility] Radiology Department (first floor) to check-in and test prep.  Please follow these instructions carefully (unless otherwise directed):   On the Night Before the Test: . Be sure to Drink plenty of water. . Do not consume any caffeinated/decaffeinated beverages or chocolate 12 hours prior to your test. Do not take any antihistamines 12 hours prior to your test.  On the Day of the Test: . Drink plenty of water. Do not drink any water within one hour of the test. . Do not eat any food 4 hours prior to the test. . You may take your regular medications prior to the test.  . Take one tablet of metoprolol (Lopressor) 50 mg two hours prior to test.  Take the other tablet with you . HOLD Hydrochlorothiazide morning of the test. . FEMALES- please wear underwire-free bra if  available       After the Test: . Drink plenty of water. . After receiving IV contrast, you may experience a mild flushed feeling. This is normal. . On occasion, you may experience a mild rash up to 24 hours after the test. This is not dangerous. If this occurs, you can take Benadryl 25 mg and increase your fluid intake. . If you experience trouble breathing, this can be serious. If it is severe call  [phone] IMMEDIATELY. If it is mild, please call our office. . If you take any of these medications: Glipizide/Metformin, Avandament, Glucavance, please do not take 48 hours after completing test unless otherwise instructed.  Once we have confirmed authorization from your insurance company, we will call you to set up a date and time for your test.   For non-scheduling related questions, please contact the cardiac imaging nurse navigator should you have any questions/concerns: Marchia Bond, Cardiac Imaging Nurse Navigator Burley Saver, Interim Cardiac Imaging Nurse [facility] Direct Office Dial: [phone]   For scheduling needs, including cancellations and rescheduling, please call [phone].

## 2019-07-19 DIAGNOSIS — Z01419 Encounter for gynecological examination (general) (routine) without abnormal findings: Secondary | ICD-10-CM | POA: Diagnosis not present

## 2019-07-19 DIAGNOSIS — Z1389 Encounter for screening for other disorder: Secondary | ICD-10-CM | POA: Diagnosis not present

## 2019-07-19 DIAGNOSIS — Z130 Encounter for screening for diseases of the blood and blood-forming organs and certain disorders involving the immune mechanism: Secondary | ICD-10-CM | POA: Diagnosis not present

## 2019-07-19 DIAGNOSIS — E669 Obesity, unspecified: Secondary | ICD-10-CM | POA: Diagnosis not present

## 2019-08-16 ENCOUNTER — Other Ambulatory Visit: Payer: BC Managed Care – PPO

## 2019-08-18 ENCOUNTER — Other Ambulatory Visit (HOSPITAL_COMMUNITY): Payer: BC Managed Care – PPO

## 2019-09-01 DIAGNOSIS — T7840XA Allergy, unspecified, initial encounter: Secondary | ICD-10-CM | POA: Diagnosis not present

## 2019-09-01 DIAGNOSIS — L509 Urticaria, unspecified: Secondary | ICD-10-CM | POA: Diagnosis not present

## 2019-09-08 ENCOUNTER — Ambulatory Visit (HOSPITAL_COMMUNITY): Payer: BC Managed Care – PPO | Attending: Internal Medicine

## 2019-09-08 ENCOUNTER — Other Ambulatory Visit: Payer: Self-pay

## 2019-09-08 DIAGNOSIS — R072 Precordial pain: Secondary | ICD-10-CM

## 2019-09-08 DIAGNOSIS — R079 Chest pain, unspecified: Secondary | ICD-10-CM | POA: Insufficient documentation

## 2019-09-14 ENCOUNTER — Telehealth: Payer: Self-pay | Admitting: Cardiovascular Disease

## 2019-09-14 DIAGNOSIS — Z01812 Encounter for preprocedural laboratory examination: Secondary | ICD-10-CM

## 2019-09-14 NOTE — Telephone Encounter (Signed)
Echo results provided. Pt having cCTA on 7/23. Needs BMET. Scheduled for tomorrow. Tomorrow she will also pick up AVS from previous ov so that she can review the instructions.  We did go over them briefly on the phone.

## 2019-09-14 NOTE — Telephone Encounter (Signed)
New message ° ° ° ° ° °Returning a call to the nurse to get echo results °

## 2019-09-15 ENCOUNTER — Other Ambulatory Visit: Payer: BC Managed Care – PPO | Admitting: *Deleted

## 2019-09-15 ENCOUNTER — Other Ambulatory Visit: Payer: Self-pay

## 2019-09-15 DIAGNOSIS — Z01812 Encounter for preprocedural laboratory examination: Secondary | ICD-10-CM | POA: Diagnosis not present

## 2019-09-16 LAB — BASIC METABOLIC PANEL
BUN/Creatinine Ratio: 22 (ref 9–23)
BUN: 19 mg/dL (ref 6–24)
CO2: 28 mmol/L (ref 20–29)
Calcium: 10.3 mg/dL — ABNORMAL HIGH (ref 8.7–10.2)
Chloride: 94 mmol/L — ABNORMAL LOW (ref 96–106)
Creatinine, Ser: 0.87 mg/dL (ref 0.57–1.00)
GFR calc Af Amer: 87 mL/min/{1.73_m2} (ref >59)
GFR calc non Af Amer: 75 mL/min/{1.73_m2} (ref >59)
Glucose: 139 mg/dL — ABNORMAL HIGH (ref 65–99)
Potassium: 4.2 mmol/L (ref 3.5–5.2)
Sodium: 139 mmol/L (ref 134–144)

## 2019-09-21 ENCOUNTER — Telehealth (HOSPITAL_COMMUNITY): Payer: Self-pay | Admitting: *Deleted

## 2019-09-21 NOTE — Telephone Encounter (Signed)
Attempted to call patient regarding upcoming cardiac CT appointment. °Left message on voicemail with name and callback number ° °Callia Swim Tai RN Navigator Cardiac Imaging °[facility] °[phone] Office °[phone] Cell °

## 2019-09-21 NOTE — Telephone Encounter (Signed)
Patient returning call regarding upcoming cardiac imaging study; pt verbalizes understanding of appt date/time, parking situation and where to check in, pre-test NPO status and medications ordered, and verified current allergies; name and call back number provided for further questions should they arise  Lorretta Kerce Tai RN Navigator Cardiac Imaging [facility] [phone] office [phone] cell  

## 2019-09-22 ENCOUNTER — Ambulatory Visit (HOSPITAL_COMMUNITY)
Admission: RE | Admit: 2019-09-22 | Discharge: 2019-09-22 | Disposition: A | Discharge status: Home / Self Care | Payer: BC Managed Care – PPO | Source: Ambulatory Visit | Attending: Cardiovascular Disease | Admitting: Cardiovascular Disease

## 2019-09-22 ENCOUNTER — Other Ambulatory Visit: Payer: Self-pay

## 2019-09-22 DIAGNOSIS — R072 Precordial pain: Secondary | ICD-10-CM | POA: Diagnosis not present

## 2019-09-22 MED ORDER — NITROGLYCERIN 0.4 MG SL SUBL
SUBLINGUAL_TABLET | SUBLINGUAL | Status: AC
  Administered 2019-09-22: 0.8 mg via SUBLINGUAL
  Filled 2019-09-22: qty 2

## 2019-09-22 MED ORDER — NITROGLYCERIN 0.4 MG SL SUBL: 0.8000 mg | SUBLINGUAL_TABLET | Freq: Once | SUBLINGUAL | Status: AC

## 2019-09-22 MED ORDER — IOHEXOL 350 MG/ML SOLN
80.0000 mL | Freq: Once | INTRAVENOUS | Status: AC | PRN
  Administered 2019-09-22: 80 mL via INTRAVENOUS

## 2019-09-28 ENCOUNTER — Other Ambulatory Visit: Payer: Self-pay | Admitting: *Deleted

## 2019-09-28 MED ORDER — ASPIRIN EC 81 MG PO TBEC
81.0000 mg | DELAYED_RELEASE_TABLET | Freq: Every day | ORAL | 3 refills | Status: AC
Start: 2019-09-28 — End: ?

## 2019-09-28 NOTE — Progress Notes (Signed)
[name], MD  09/27/2019 4:38 PM EDT     Very mild coronary plaque. Continue statin. Consider daily ASA 81 mg. Alexis Leblanc     Pt agrees to aspirin 81 mg daily.  Will continue Crestor 10 mg daily.

## 2019-11-14 DIAGNOSIS — E119 Type 2 diabetes mellitus without complications: Secondary | ICD-10-CM | POA: Diagnosis not present

## 2019-11-16 DIAGNOSIS — E119 Type 2 diabetes mellitus without complications: Secondary | ICD-10-CM | POA: Diagnosis not present

## 2019-11-16 DIAGNOSIS — I10 Essential (primary) hypertension: Secondary | ICD-10-CM | POA: Diagnosis not present

## 2019-11-16 DIAGNOSIS — E7800 Pure hypercholesterolemia, unspecified: Secondary | ICD-10-CM | POA: Diagnosis not present

## 2019-11-16 DIAGNOSIS — E039 Hypothyroidism, unspecified: Secondary | ICD-10-CM | POA: Diagnosis not present

## 2019-11-21 DIAGNOSIS — Z1159 Encounter for screening for other viral diseases: Secondary | ICD-10-CM | POA: Diagnosis not present

## 2019-11-23 DIAGNOSIS — Z86010 Personal history of colonic polyps: Secondary | ICD-10-CM | POA: Diagnosis not present

## 2019-12-18 DIAGNOSIS — Y93F2 Activity, caregiving, lifting: Secondary | ICD-10-CM | POA: Diagnosis not present

## 2019-12-18 DIAGNOSIS — S161XXA Strain of muscle, fascia and tendon at neck level, initial encounter: Secondary | ICD-10-CM | POA: Diagnosis not present

## 2020-04-25 ENCOUNTER — Other Ambulatory Visit: Payer: Self-pay

## 2020-04-25 DIAGNOSIS — Z0000 Encounter for general adult medical examination without abnormal findings: Secondary | ICD-10-CM

## 2020-05-29 DIAGNOSIS — I10 Essential (primary) hypertension: Secondary | ICD-10-CM | POA: Diagnosis not present

## 2020-05-29 DIAGNOSIS — E1169 Type 2 diabetes mellitus with other specified complication: Secondary | ICD-10-CM | POA: Diagnosis not present

## 2020-05-29 DIAGNOSIS — E7800 Pure hypercholesterolemia, unspecified: Secondary | ICD-10-CM | POA: Diagnosis not present

## 2020-05-29 DIAGNOSIS — E039 Hypothyroidism, unspecified: Secondary | ICD-10-CM | POA: Diagnosis not present

## 2020-06-19 ENCOUNTER — Inpatient Hospital Stay: Admission: RE | Admit: 2020-06-19 | Payer: BC Managed Care – PPO | Source: Ambulatory Visit

## 2020-06-24 ENCOUNTER — Ambulatory Visit
Admission: RE | Admit: 2020-06-24 | Discharge: 2020-06-24 | Disposition: A | Discharge status: Home / Self Care | Payer: BC Managed Care – PPO | Source: Ambulatory Visit | Attending: Family Medicine | Admitting: Family Medicine

## 2020-06-24 ENCOUNTER — Other Ambulatory Visit: Payer: Self-pay

## 2020-06-24 DIAGNOSIS — Z0000 Encounter for general adult medical examination without abnormal findings: Secondary | ICD-10-CM

## 2020-06-24 DIAGNOSIS — Z1231 Encounter for screening mammogram for malignant neoplasm of breast: Secondary | ICD-10-CM | POA: Diagnosis not present

## 2020-07-22 DIAGNOSIS — Z1389 Encounter for screening for other disorder: Secondary | ICD-10-CM | POA: Diagnosis not present

## 2020-07-22 DIAGNOSIS — Z6841 Body Mass Index (BMI) 40.0 and over, adult: Secondary | ICD-10-CM | POA: Diagnosis not present

## 2020-07-22 DIAGNOSIS — Z01419 Encounter for gynecological examination (general) (routine) without abnormal findings: Secondary | ICD-10-CM | POA: Diagnosis not present

## 2020-07-22 DIAGNOSIS — Z130 Encounter for screening for diseases of the blood and blood-forming organs and certain disorders involving the immune mechanism: Secondary | ICD-10-CM | POA: Diagnosis not present

## 2020-12-19 DIAGNOSIS — E039 Hypothyroidism, unspecified: Secondary | ICD-10-CM | POA: Diagnosis not present

## 2020-12-19 DIAGNOSIS — I10 Essential (primary) hypertension: Secondary | ICD-10-CM | POA: Diagnosis not present

## 2020-12-19 DIAGNOSIS — E7800 Pure hypercholesterolemia, unspecified: Secondary | ICD-10-CM | POA: Diagnosis not present

## 2020-12-19 DIAGNOSIS — E1169 Type 2 diabetes mellitus with other specified complication: Secondary | ICD-10-CM | POA: Diagnosis not present

## 2020-12-30 DIAGNOSIS — E119 Type 2 diabetes mellitus without complications: Secondary | ICD-10-CM | POA: Diagnosis not present

## 2021-01-17 DIAGNOSIS — N3000 Acute cystitis without hematuria: Secondary | ICD-10-CM | POA: Diagnosis not present

## 2021-01-17 DIAGNOSIS — R109 Unspecified abdominal pain: Secondary | ICD-10-CM | POA: Diagnosis not present

## 2021-01-31 ENCOUNTER — Encounter: Payer: BC Managed Care – PPO | Attending: Family Medicine | Admitting: Dietician

## 2021-01-31 ENCOUNTER — Other Ambulatory Visit: Payer: Self-pay

## 2021-01-31 ENCOUNTER — Encounter: Payer: Self-pay | Admitting: Dietician

## 2021-01-31 DIAGNOSIS — E119 Type 2 diabetes mellitus without complications: Secondary | ICD-10-CM | POA: Diagnosis not present

## 2021-01-31 NOTE — Progress Notes (Signed)
Diabetes Self-Management Education  Visit Type: First/Initial  Appt. Start Time: 1045 Appt. End Time: 1200  02/06/2021  [name], identified by name and date of birth, is a 55 y.o. female with a diagnosis of Diabetes: Type 2.   ASSESSMENT Patient is here today alone.   She states that she forgets to check her blood sugar at times and is interested in a CGM.  Showed patient the Josephine Igo and Dexcom options and she will check with her insurance for coverage.  She can make an appointment for training when she receives it. Decreased appetite and complains of a metal taste. Complains of headaches.  History includes Type 2 diabetes, morbid obesity, hypothyroid, HTN, HLD Medications include Steglatro (stopped due to hair loss and vomiting), Metformin, Januvia,  Labs noted to include:  Cholesterol 180, Triglycerides 115, HDL 50, LDL 109, A1C 8.4% 18/34/3735  Weight hx: 249 lbs 01/31/2021 290 lbs about 2 years ago highest adult weight 123 lbs lowest adult weight.  Gained after having children.  Gained with yoyo dieting after the [employer] and other diets.  Patient's son just moved back.  She does the shopping and cooking. Eats very simple.  She works for [employer] and is working from home. Recent Plantar Fascitis. Is considering an under the desk bike. Lactose intolerant. Has an air fryer. 3-4 beer occasionally on the weekends but none this week, rare liquor Height 5\' 5"  (1.651 m), weight 249 lb (112.9 kg). Body mass index is 41.44 kg/m.   Diabetes Self-Management Education - 01/31/21 1129       Visit Information   Visit Type First/Initial      Initial Visit   Diabetes Type Type 2    Are you currently following a meal plan? No    Are you taking your medications as prescribed? Yes    Date Diagnosed 2015      Health Coping   How would you rate your overall health? Fair      Psychosocial Assessment   Patient Belief/Attitude about Diabetes Motivated to manage  diabetes    Self-care barriers None    Self-management support Doctor's office    Other persons present Patient    Patient Concerns Nutrition/Meal planning    Special Needs None    Preferred Learning Style No preference indicated    Learning Readiness Ready    How often do you need to have someone help you when you read instructions, pamphlets, or other written materials from your doctor or pharmacy? 1 - Never    What is the last grade level you completed in school? 3 years college      Pre-Education Assessment   Patient understands the diabetes disease and treatment process. Needs Review    Patient understands incorporating nutritional management into lifestyle. Needs Review    Patient undertands incorporating physical activity into lifestyle. Needs Review    Patient understands using medications safely. Needs Review    Patient understands monitoring blood glucose, interpreting and using results Needs Review    Patient understands prevention, detection, and treatment of acute complications. Needs Review    Patient understands prevention, detection, and treatment of chronic complications. Needs Review    Patient understands how to develop strategies to address psychosocial issues. Needs Review    Patient understands how to develop strategies to promote health/change behavior. Needs Review      Complications   Last HgB A1C per patient/outside source 8.4 %   12/19/20   How often do you  check your blood sugar? 3-4 times / week    Fasting Blood glucose range (mg/dL) 128-786    Number of hypoglycemic episodes per month 0    Have you had a dilated eye exam in the past 12 months? Yes    Have you had a dental exam in the past 12 months? Yes    Are you checking your feet? No      Dietary Intake   Breakfast 2 eggs, 2 strips bacon, occasional grits OR instant sweetened oatmeal, occasional boiled egg AND occasional juice    Snack (morning) none    Lunch leftovers    Snack (afternoon) none  (crackers or trail mix when she is at the office)    Dinner meatballs, yams, pineapple OR chicken wings (air fried), broccoli and cheese or vegetable medley, rice or corn OR hot dog with bun (2)    Snack (evening) occasional fruit loops or cornflakes with sugar, 2% milk    Beverage(s) water, regular gingerale (due to nausea), occasional juice, beer (3-4 per day on weekends but none this week)      Exercise   Exercise Type ADL's    How many days per week to you exercise? 0    How many minutes per day do you exercise? 0    Total minutes per week of exercise 0      Patient Education   Previous Diabetes Education Yes (please comment)   when diagnosed   Disease state  Definition of diabetes, type 1 and 2, and the diagnosis of diabetes    Nutrition management  Role of diet in the treatment of diabetes and the relationship between the three main macronutrients and blood glucose level;Meal options for control of blood glucose level and chronic complications.;Effects of alcohol on blood glucose and safety factors with consumption of alcohol.    Physical activity and exercise  Role of exercise on diabetes management, blood pressure control and cardiac health.;Helped patient identify appropriate exercises in relation to his/her diabetes, diabetes complications and other health issue.    Medications Reviewed patients medication for diabetes, action, purpose, timing of dose and side effects.    Monitoring Taught/discussed recording of test results and interpretation of SMBG.;Identified appropriate SMBG and/or A1C goals.;Daily foot exams;Yearly dilated eye exam    Acute complications Taught treatment of hypoglycemia - the 15 rule.;Discussed and identified patients' treatment of hyperglycemia.    Chronic complications Relationship between chronic complications and blood glucose control    Psychosocial adjustment Worked with patient to identify barriers to care and solutions      Individualized Goals  (developed by patient)   Nutrition General guidelines for healthy choices and portions discussed    Physical Activity Exercise 5-7 days per week;30 minutes per day    Medications take my medication as prescribed    Monitoring  test my blood glucose as discussed    Reducing Risk examine blood glucose patterns;increase portions of healthy fats      Post-Education Assessment   Patient understands the diabetes disease and treatment process. Demonstrates understanding / competency    Patient understands incorporating nutritional management into lifestyle. Needs Review    Patient undertands incorporating physical activity into lifestyle. Demonstrates understanding / competency    Patient understands using medications safely. Demonstrates understanding / competency    Patient understands monitoring blood glucose, interpreting and using results Demonstrates understanding / competency    Patient understands prevention, detection, and treatment of acute complications. Demonstrates understanding / competency    Patient understands  prevention, detection, and treatment of chronic complications. Demonstrates understanding / competency    Patient understands how to develop strategies to address psychosocial issues. Demonstrates understanding / competency    Patient understands how to develop strategies to promote health/change behavior. Needs Review      Outcomes   Expected Outcomes Demonstrated interest in learning. Expect positive outcomes    Future DMSE 2 months    Program Status Not Completed             Individualized Plan for Diabetes Self-Management Training:   Learning Objective:  Patient will have a greater understanding of diabetes self-management. Patient education plan is to attend individual and/or group sessions per assessed needs and concerns.   Plan:   Patient Instructions  Consider changing to low fat lactaid milk or unsweetened almond milk  Aim to be more active.  30 minutes  most days.  Options:  Water Aerobics  Pedal bike under your desk  Walking  You tube  [employer] without skin or choose beans or egg Increased vegetables   Expected Outcomes:  Demonstrated interest in learning. Expect positive outcomes  Education material provided: ADA - How to Thrive: A Guide for Your Journey with Diabetes, Meal plan card, and Snack sheet  If problems or questions, patient to contact team via:  Phone  Future DSME appointment: 2 months

## 2021-01-31 NOTE — Patient Instructions (Addendum)
Consider changing to low fat lactaid milk or unsweetened almond milk  Aim to be more active.  30 minutes most days.  Options:  Water Aerobics  Pedal bike under your desk  Walking  You tube  [employer] without skin or choose beans or egg Increased vegetables

## 2021-02-10 ENCOUNTER — Telehealth: Payer: Self-pay | Admitting: Dietician

## 2021-02-10 NOTE — Telephone Encounter (Signed)
Returned patient call.  She states that her insurance covered the Dexcom CGM and that she needed training.  When I spoke to her, she stated that her son helped her set this up on Saturday.  She put this on her phone.  She set the low for 70 and high for 250. She reports monitoring the trend of her blood sugar throughout the day. She states that fasting glucose was 157 this am and now 161 after 2 boiled eggs and bacon.  She states that she has been changing her eating habits.  Reviewed that the transmitter should be used for 3 months and that the sensor will end at 10 days.  Reviewed how to end a session.  Patient to call for questions.  Oran Rein, RD, LDN, CDCES

## 2021-03-25 DIAGNOSIS — I10 Essential (primary) hypertension: Secondary | ICD-10-CM | POA: Diagnosis not present

## 2021-03-25 DIAGNOSIS — E034 Atrophy of thyroid (acquired): Secondary | ICD-10-CM | POA: Diagnosis not present

## 2021-03-25 DIAGNOSIS — E1169 Type 2 diabetes mellitus with other specified complication: Secondary | ICD-10-CM | POA: Diagnosis not present

## 2021-03-25 DIAGNOSIS — L638 Other alopecia areata: Secondary | ICD-10-CM | POA: Diagnosis not present

## 2021-03-25 DIAGNOSIS — E6609 Other obesity due to excess calories: Secondary | ICD-10-CM | POA: Diagnosis not present

## 2021-04-01 DIAGNOSIS — F1020 Alcohol dependence, uncomplicated: Secondary | ICD-10-CM | POA: Diagnosis not present

## 2021-04-01 DIAGNOSIS — F331 Major depressive disorder, recurrent, moderate: Secondary | ICD-10-CM | POA: Diagnosis not present

## 2021-04-01 DIAGNOSIS — F1220 Cannabis dependence, uncomplicated: Secondary | ICD-10-CM | POA: Diagnosis not present

## 2021-04-01 DIAGNOSIS — F4310 Post-traumatic stress disorder, unspecified: Secondary | ICD-10-CM | POA: Diagnosis not present

## 2021-04-03 ENCOUNTER — Ambulatory Visit: Payer: BC Managed Care – PPO | Admitting: Dietician

## 2021-04-04 DIAGNOSIS — F4312 Post-traumatic stress disorder, chronic: Secondary | ICD-10-CM | POA: Diagnosis not present

## 2021-04-10 DIAGNOSIS — F4312 Post-traumatic stress disorder, chronic: Secondary | ICD-10-CM | POA: Diagnosis not present

## 2021-04-17 DIAGNOSIS — I10 Essential (primary) hypertension: Secondary | ICD-10-CM | POA: Diagnosis not present

## 2021-04-17 DIAGNOSIS — E1169 Type 2 diabetes mellitus with other specified complication: Secondary | ICD-10-CM | POA: Diagnosis not present

## 2021-04-30 DIAGNOSIS — F4312 Post-traumatic stress disorder, chronic: Secondary | ICD-10-CM | POA: Diagnosis not present

## 2021-05-29 DIAGNOSIS — E1169 Type 2 diabetes mellitus with other specified complication: Secondary | ICD-10-CM | POA: Diagnosis not present

## 2021-05-29 DIAGNOSIS — I10 Essential (primary) hypertension: Secondary | ICD-10-CM | POA: Diagnosis not present

## 2021-05-29 DIAGNOSIS — Z0000 Encounter for general adult medical examination without abnormal findings: Secondary | ICD-10-CM | POA: Diagnosis not present

## 2021-05-29 DIAGNOSIS — E7800 Pure hypercholesterolemia, unspecified: Secondary | ICD-10-CM | POA: Diagnosis not present

## 2021-06-10 DIAGNOSIS — R197 Diarrhea, unspecified: Secondary | ICD-10-CM | POA: Diagnosis not present

## 2021-06-10 DIAGNOSIS — R509 Fever, unspecified: Secondary | ICD-10-CM | POA: Diagnosis not present

## 2021-06-10 DIAGNOSIS — U071 COVID-19: Secondary | ICD-10-CM | POA: Diagnosis not present

## 2021-06-10 DIAGNOSIS — E1169 Type 2 diabetes mellitus with other specified complication: Secondary | ICD-10-CM | POA: Diagnosis not present

## 2021-06-10 DIAGNOSIS — R059 Cough, unspecified: Secondary | ICD-10-CM | POA: Diagnosis not present

## 2021-07-16 ENCOUNTER — Other Ambulatory Visit: Payer: Self-pay | Admitting: Family Medicine

## 2021-07-16 DIAGNOSIS — Z1231 Encounter for screening mammogram for malignant neoplasm of breast: Secondary | ICD-10-CM

## 2021-07-23 DIAGNOSIS — Z124 Encounter for screening for malignant neoplasm of cervix: Secondary | ICD-10-CM | POA: Diagnosis not present

## 2021-07-23 DIAGNOSIS — Z1151 Encounter for screening for human papillomavirus (HPV): Secondary | ICD-10-CM | POA: Diagnosis not present

## 2021-07-30 ENCOUNTER — Ambulatory Visit
Admission: RE | Admit: 2021-07-30 | Discharge: 2021-07-30 | Disposition: A | Discharge status: Home / Self Care | Payer: BC Managed Care – PPO | Source: Ambulatory Visit | Attending: Family Medicine | Admitting: Family Medicine

## 2021-07-30 ENCOUNTER — Other Ambulatory Visit: Payer: Self-pay | Admitting: Family Medicine

## 2021-07-30 DIAGNOSIS — Z01419 Encounter for gynecological examination (general) (routine) without abnormal findings: Secondary | ICD-10-CM | POA: Diagnosis not present

## 2021-07-30 DIAGNOSIS — Z130 Encounter for screening for diseases of the blood and blood-forming organs and certain disorders involving the immune mechanism: Secondary | ICD-10-CM | POA: Diagnosis not present

## 2021-07-30 DIAGNOSIS — Z6841 Body Mass Index (BMI) 40.0 and over, adult: Secondary | ICD-10-CM | POA: Diagnosis not present

## 2021-07-30 DIAGNOSIS — Z1231 Encounter for screening mammogram for malignant neoplasm of breast: Secondary | ICD-10-CM

## 2021-07-30 DIAGNOSIS — Z1389 Encounter for screening for other disorder: Secondary | ICD-10-CM | POA: Diagnosis not present

## 2021-11-28 DIAGNOSIS — E039 Hypothyroidism, unspecified: Secondary | ICD-10-CM | POA: Diagnosis not present

## 2021-11-28 DIAGNOSIS — I10 Essential (primary) hypertension: Secondary | ICD-10-CM | POA: Diagnosis not present

## 2021-11-28 DIAGNOSIS — E1169 Type 2 diabetes mellitus with other specified complication: Secondary | ICD-10-CM | POA: Diagnosis not present

## 2021-11-28 DIAGNOSIS — E7800 Pure hypercholesterolemia, unspecified: Secondary | ICD-10-CM | POA: Diagnosis not present

## 2022-03-08 DIAGNOSIS — K529 Noninfective gastroenteritis and colitis, unspecified: Secondary | ICD-10-CM | POA: Diagnosis not present

## 2022-03-08 DIAGNOSIS — R1013 Epigastric pain: Secondary | ICD-10-CM | POA: Diagnosis not present

## 2022-03-09 DIAGNOSIS — K529 Noninfective gastroenteritis and colitis, unspecified: Secondary | ICD-10-CM | POA: Diagnosis not present

## 2022-05-12 DIAGNOSIS — E119 Type 2 diabetes mellitus without complications: Secondary | ICD-10-CM | POA: Diagnosis not present

## 2022-07-09 ENCOUNTER — Other Ambulatory Visit: Payer: Self-pay | Admitting: Family Medicine

## 2022-07-09 DIAGNOSIS — Z1231 Encounter for screening mammogram for malignant neoplasm of breast: Secondary | ICD-10-CM

## 2022-07-15 DIAGNOSIS — I700 Atherosclerosis of aorta: Secondary | ICD-10-CM | POA: Diagnosis not present

## 2022-07-15 DIAGNOSIS — E7800 Pure hypercholesterolemia, unspecified: Secondary | ICD-10-CM | POA: Diagnosis not present

## 2022-07-15 DIAGNOSIS — E1169 Type 2 diabetes mellitus with other specified complication: Secondary | ICD-10-CM | POA: Diagnosis not present

## 2022-07-15 DIAGNOSIS — I10 Essential (primary) hypertension: Secondary | ICD-10-CM | POA: Diagnosis not present

## 2022-07-15 DIAGNOSIS — E039 Hypothyroidism, unspecified: Secondary | ICD-10-CM | POA: Diagnosis not present

## 2022-07-15 DIAGNOSIS — Z0000 Encounter for general adult medical examination without abnormal findings: Secondary | ICD-10-CM | POA: Diagnosis not present

## 2022-08-10 ENCOUNTER — Ambulatory Visit: Payer: BC Managed Care – PPO

## 2022-08-10 DIAGNOSIS — H401131 Primary open-angle glaucoma, bilateral, mild stage: Secondary | ICD-10-CM | POA: Diagnosis not present

## 2022-08-21 ENCOUNTER — Ambulatory Visit
Admission: RE | Admit: 2022-08-21 | Discharge: 2022-08-21 | Disposition: A | Discharge status: Home / Self Care | Payer: BC Managed Care – PPO | Source: Ambulatory Visit | Attending: Family Medicine | Admitting: Family Medicine

## 2022-08-21 ENCOUNTER — Encounter: Payer: Self-pay | Admitting: Radiology

## 2022-08-21 DIAGNOSIS — Z1231 Encounter for screening mammogram for malignant neoplasm of breast: Secondary | ICD-10-CM | POA: Diagnosis not present

## 2023-02-02 DIAGNOSIS — E039 Hypothyroidism, unspecified: Secondary | ICD-10-CM | POA: Diagnosis not present

## 2023-02-02 DIAGNOSIS — J4530 Mild persistent asthma, uncomplicated: Secondary | ICD-10-CM | POA: Diagnosis not present

## 2023-02-02 DIAGNOSIS — E1169 Type 2 diabetes mellitus with other specified complication: Secondary | ICD-10-CM | POA: Diagnosis not present

## 2023-02-02 DIAGNOSIS — I10 Essential (primary) hypertension: Secondary | ICD-10-CM | POA: Diagnosis not present

## 2023-02-02 DIAGNOSIS — E1165 Type 2 diabetes mellitus with hyperglycemia: Secondary | ICD-10-CM | POA: Diagnosis not present

## 2023-03-24 DIAGNOSIS — G4719 Other hypersomnia: Secondary | ICD-10-CM | POA: Diagnosis not present

## 2023-03-24 DIAGNOSIS — G4733 Obstructive sleep apnea (adult) (pediatric): Secondary | ICD-10-CM | POA: Diagnosis not present

## 2023-04-26 DIAGNOSIS — H401131 Primary open-angle glaucoma, bilateral, mild stage: Secondary | ICD-10-CM | POA: Diagnosis not present

## 2023-05-03 DIAGNOSIS — J4530 Mild persistent asthma, uncomplicated: Secondary | ICD-10-CM | POA: Diagnosis not present

## 2023-05-03 DIAGNOSIS — I10 Essential (primary) hypertension: Secondary | ICD-10-CM | POA: Diagnosis not present

## 2023-05-03 DIAGNOSIS — E039 Hypothyroidism, unspecified: Secondary | ICD-10-CM | POA: Diagnosis not present

## 2023-05-03 DIAGNOSIS — E1165 Type 2 diabetes mellitus with hyperglycemia: Secondary | ICD-10-CM | POA: Diagnosis not present

## 2023-08-03 DIAGNOSIS — E7800 Pure hypercholesterolemia, unspecified: Secondary | ICD-10-CM | POA: Diagnosis not present

## 2023-08-03 DIAGNOSIS — E1169 Type 2 diabetes mellitus with other specified complication: Secondary | ICD-10-CM | POA: Diagnosis not present

## 2023-08-03 DIAGNOSIS — E039 Hypothyroidism, unspecified: Secondary | ICD-10-CM | POA: Diagnosis not present

## 2023-08-03 DIAGNOSIS — E1165 Type 2 diabetes mellitus with hyperglycemia: Secondary | ICD-10-CM | POA: Diagnosis not present

## 2023-08-03 DIAGNOSIS — Z0000 Encounter for general adult medical examination without abnormal findings: Secondary | ICD-10-CM | POA: Diagnosis not present

## 2023-08-03 DIAGNOSIS — J4530 Mild persistent asthma, uncomplicated: Secondary | ICD-10-CM | POA: Diagnosis not present

## 2023-08-03 DIAGNOSIS — I10 Essential (primary) hypertension: Secondary | ICD-10-CM | POA: Diagnosis not present

## 2023-08-05 ENCOUNTER — Other Ambulatory Visit: Payer: Self-pay | Admitting: Internal Medicine

## 2023-08-05 DIAGNOSIS — Z1231 Encounter for screening mammogram for malignant neoplasm of breast: Secondary | ICD-10-CM

## 2023-08-10 DIAGNOSIS — E119 Type 2 diabetes mellitus without complications: Secondary | ICD-10-CM | POA: Diagnosis not present

## 2023-08-19 DIAGNOSIS — Z130 Encounter for screening for diseases of the blood and blood-forming organs and certain disorders involving the immune mechanism: Secondary | ICD-10-CM | POA: Diagnosis not present

## 2023-08-19 DIAGNOSIS — Z01419 Encounter for gynecological examination (general) (routine) without abnormal findings: Secondary | ICD-10-CM | POA: Diagnosis not present

## 2023-08-27 ENCOUNTER — Ambulatory Visit
Admission: RE | Admit: 2023-08-27 | Discharge: 2023-08-27 | Disposition: A | Discharge status: Home / Self Care | Source: Ambulatory Visit | Attending: Internal Medicine

## 2023-08-27 DIAGNOSIS — Z1231 Encounter for screening mammogram for malignant neoplasm of breast: Secondary | ICD-10-CM | POA: Diagnosis not present

## 2023-12-09 DIAGNOSIS — H401131 Primary open-angle glaucoma, bilateral, mild stage: Secondary | ICD-10-CM | POA: Diagnosis not present

## 2024-01-19 DIAGNOSIS — E1169 Type 2 diabetes mellitus with other specified complication: Secondary | ICD-10-CM | POA: Diagnosis not present

## 2024-01-19 DIAGNOSIS — E039 Hypothyroidism, unspecified: Secondary | ICD-10-CM | POA: Diagnosis not present

## 2024-01-19 DIAGNOSIS — E1165 Type 2 diabetes mellitus with hyperglycemia: Secondary | ICD-10-CM | POA: Diagnosis not present

## 2024-01-19 DIAGNOSIS — E7800 Pure hypercholesterolemia, unspecified: Secondary | ICD-10-CM | POA: Diagnosis not present

## 2024-01-19 DIAGNOSIS — I10 Essential (primary) hypertension: Secondary | ICD-10-CM | POA: Diagnosis not present

## 2024-01-19 DIAGNOSIS — J4530 Mild persistent asthma, uncomplicated: Secondary | ICD-10-CM | POA: Diagnosis not present

## 2024-02-04 DIAGNOSIS — L910 Hypertrophic scar: Secondary | ICD-10-CM | POA: Diagnosis not present
